# Patient Record
Sex: Female | Born: 1990 | Race: White | Hispanic: No | Marital: Single | Smoking: Former smoker
Health system: Southern US, Community
[De-identification: ages and names within clinical notes are randomized; demographics above are authoritative.]

## PROBLEM LIST (undated history)

## (undated) DIAGNOSIS — N644 Mastodynia: Secondary | ICD-10-CM

## (undated) HISTORY — PX: TONSILLECTOMY: SUR1361

---
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
## 2012-01-03 ENCOUNTER — Observation Stay: Payer: Self-pay | Admitting: Obstetrics and Gynecology

## 2012-04-18 ENCOUNTER — Observation Stay: Payer: Self-pay | Admitting: Obstetrics and Gynecology

## 2012-05-06 ENCOUNTER — Inpatient Hospital Stay: Payer: Self-pay | Admitting: Obstetrics and Gynecology

## 2012-05-06 LAB — CBC WITH DIFFERENTIAL/PLATELET
Basophil #: 0 10*3/uL (ref 0.0–0.1)
Basophil %: 0.5 %
Eosinophil #: 0.1 10*3/uL (ref 0.0–0.7)
Eosinophil %: 0.9 %
HGB: 12.8 g/dL (ref 12.0–16.0)
Lymphocyte %: 22.3 %
MCV: 91 fL (ref 80–100)
Monocyte #: 0.7 x10 3/mm (ref 0.2–0.9)
Monocyte %: 9.1 %
Neutrophil #: 5.1 10*3/uL (ref 1.4–6.5)
RBC: 4.11 10*6/uL (ref 3.80–5.20)
WBC: 7.6 10*3/uL (ref 3.6–11.0)

## 2012-05-06 LAB — PROTEIN / CREATININE RATIO, URINE
Creatinine, Urine: 22.2 mg/dL — ABNORMAL LOW (ref 30.0–125.0)
Protein, Random Urine: <5 mg/dL — ABNORMAL LOW (ref 0–12)

## 2012-05-10 LAB — PLATELET COUNT: Platelet: 167 10*3/uL (ref 150–440)

## 2013-07-15 ENCOUNTER — Ambulatory Visit: Payer: Self-pay | Admitting: Family Medicine

## 2013-08-13 ENCOUNTER — Emergency Department: Payer: Self-pay | Admitting: Emergency Medicine

## 2013-11-03 ENCOUNTER — Ambulatory Visit: Payer: Self-pay | Admitting: Podiatry

## 2014-02-02 ENCOUNTER — Emergency Department: Payer: Self-pay | Admitting: Emergency Medicine

## 2014-02-02 LAB — URINALYSIS, COMPLETE
BLOOD: NEGATIVE
Bilirubin,UR: NEGATIVE
GLUCOSE, UR: NEGATIVE mg/dL (ref 0–75)
Ketone: NEGATIVE
Nitrite: NEGATIVE
PH: 8 (ref 4.5–8.0)
PROTEIN: NEGATIVE
RBC,UR: 1 /HPF (ref 0–5)
SQUAMOUS EPITHELIAL: NONE SEEN
Specific Gravity: 1.01 (ref 1.003–1.030)

## 2014-02-04 LAB — URINE CULTURE

## 2014-12-14 ENCOUNTER — Emergency Department: Payer: Self-pay | Admitting: Emergency Medicine

## 2014-12-14 LAB — CBC
HCT: 40.4 % (ref 35.0–47.0)
HGB: 13.2 g/dL (ref 12.0–16.0)
MCH: 29.9 pg (ref 26.0–34.0)
MCHC: 32.6 g/dL (ref 32.0–36.0)
MCV: 92 fL (ref 80–100)
PLATELETS: 262 10*3/uL (ref 150–440)
RBC: 4.4 10*6/uL (ref 3.80–5.20)
RDW: 13 % (ref 11.5–14.5)
WBC: 7.8 10*3/uL (ref 3.6–11.0)

## 2014-12-14 LAB — COMPREHENSIVE METABOLIC PANEL
ALBUMIN: 3.8 g/dL (ref 3.4–5.0)
ALT: 20 U/L
AST: 9 U/L — AB (ref 15–37)
Alkaline Phosphatase: 80 U/L
Anion Gap: 5 — ABNORMAL LOW (ref 7–16)
BILIRUBIN TOTAL: 0.4 mg/dL (ref 0.2–1.0)
BUN: 13 mg/dL (ref 7–18)
CALCIUM: 8.7 mg/dL (ref 8.5–10.1)
CHLORIDE: 107 mmol/L (ref 98–107)
Co2: 29 mmol/L (ref 21–32)
Creatinine: 0.69 mg/dL (ref 0.60–1.30)
EGFR (African American): >60
EGFR (Non-African Amer.): >60
Glucose: 94 mg/dL (ref 65–99)
Osmolality: 281 (ref 275–301)
Potassium: 3.8 mmol/L (ref 3.5–5.1)
SODIUM: 141 mmol/L (ref 136–145)
Total Protein: 7 g/dL (ref 6.4–8.2)

## 2014-12-14 LAB — URINALYSIS, COMPLETE
RBC,UR: 4945 /HPF (ref 0–5)
SPECIFIC GRAVITY: 1.026 (ref 1.003–1.030)
WBC UR: 18 /HPF (ref 0–5)

## 2015-04-24 NOTE — Discharge Summary (Signed)
PATIENT NAMTomma Woods:  Call, Brittanya L MR#:  [id number] DATE OF BIRTH:  Jul 12, 1990  DATE OF ADMISSION:  05/06/2012 DATE OF DISCHARGE:  05/11/2012  PRINCIPAL PROCEDURE: Trial of labor followed by low transverse cesarean section for cephalopelvic disproportion.   HOSPITAL COURSE: The patient was diagnosed with pregnancy-induced hypertension prior to surgery and was started on magnesium sulfate for prophylaxis. The patient's postoperative day one hematocrit was 31.0%. Blood pressures remained elevated. On postoperative day three, she was started on labetalol 100 mg b.i.d.  The patient was discharged home on postoperative day four without complication with Norco, prenatal vitamins, and ibuprofen. Staples were removed and Steri-Strips applied.   FOLLOW UP: The patient will follow up with Dr. Feliberto GottronSchermerhorn in two weeks for wound care or before if she has wound drainage, fever, nausea, or vomiting.   ____________________________ Suzy Bouchardhomas J. Adrienna Karis, MD tjs:cbb D: 05/19/2012 10:34:19 ET T: 05/19/2012 10:49:36 ET JOB#: [id number]  cc: Suzy Bouchardhomas J. Juvon Teater, MD, <Dictator> Suzy BouchardHOMAS J Skyelyn Scruggs MD ELECTRONICALLY SIGNED 05/22/2012 9:12

## 2015-04-24 NOTE — Op Note (Signed)
PATIENT NAMEKIERSTON, Woods MR#:  [id number] DATE OF BIRTH:  1990-09-30  DATE OF PROCEDURE:  05/07/2012  PREOPERATIVE DIAGNOSES:  1. Arrest of descent. 2. Cephalopelvic disproportion.   POSTOPERATIVE DIAGNOSES:  1. Arrest of descent. 2. Cephalopelvic disproportion.   PROCEDURE: Primary low transverse cesarean section.   SURGEON: Suzy Bouchard, M.D.   FIRST ASSISTANT: Acquanetta Belling, MD  ANESTHESIA: Spinal.   INDICATION: This is a 25 year old gravida 1, para 0 a patient who has been laboring throughout the night. She pushed for three hours with fetal caput noted on exam with arrest of descent diagnosis.   DESCRIPTION OF PROCEDURE: After adequate spinal anesthesia, the patient was placed in the dorsal supine position with a hip roll under the right side. The patient's abdomen was prepped and draped in normal sterile fashion. A Pfannenstiel incision was made two fingerbreadths above the symphysis pubis. Sharp dissection was used to identify the fascia. The fascia was opened in the midline and opened in a transverse fashion. The superior aspect of the fascia was grasped with Kocher clamps and the recti muscles dissected free. The inferior aspect of the fascia was grasped with Kocher clamps and the pyramidalis muscle was dissected free. Entry into the peritoneal cavity was accomplished sharply. The vesicouterine peritoneal fold was identified and a bladder flap was created. The bladder was reflected inferior. A low transverse uterine incision was made. Upon entry into the endometrial cavity, clear fluid resulted. The incision was extended with blunt transverse traction. An extremely wedged fetal head was encountered. A nursing hand was requested and nursing assisted with vaginal hand to elevate the fetal head and the head was brought to the incision followed by placement of the vacuum to the occiput. With one gentle pull the head was delivered. The vacuum was removed. A loose nuchal cord  was reduced. The shoulders and body were delivered. A slightly floppy infant was passed to Dr. Awanda Mink who assigned Apgar scores of 7 and 9. The placenta was manually delivered and intravenous Pitocin was administered. The uterus was exteriorized. The endometrial cavity was wiped clean with laparotomy tape. The uterine incision was then closed with one chromic suture in a running locking fashion with good approximation of edges. Good hemostasis was noted. The fallopian tubes and ovaries appeared normal. The posterior cul-de-sac was irrigated and suctioned. The uterus was placed back into the abdominal cavity. The paracolic gutters were wiped clean with laparotomy tape, and the uterine incision again appeared hemostatic. Interceed was placed over the uterine incision, in a T-shaped fashion. The On-Q pump system was brought up to the operative field. Two catheters were placed 1 cm inferior to the umbilicus and were placed subfascially. The fascia was then closed above the catheters with 0 Vicryl in a running nonlocking fashion. The subcutaneous tissues were then irrigated and bovied for hemostasis. The skin was reapproximated with staples. The catheters were then coiled on the abdomen and secured with Tegaderm placed on top and 5 mL of 0.5% Marcaine was placed at each catheter site. There were no complications. Estimated blood loss 600 mL. The patient received 2 grams IV Ancef prior to commencement of the case. Magnesium was stopped temporarily during the procedure and will be restarted postoperatively for eclampsia prophylaxis. Intraoperative fluids 1400 mL. The patient did have bloody urine prior to commencement of the case and the Foley bag still showed blood-tinged urine at the end of the case. The patient was taken to the recovery room in good condition.  ____________________________  Suzy Bouchardhomas J. Dreana Britz, MD tjs:slb D: 05/07/2012 14:38:20 ET T: 05/07/2012 15:08:44 ET JOB#: [id number]  cc: Suzy Bouchardhomas J.  Kastin Cerda, MD, <Dictator> Suzy BouchardHOMAS J Brynn Mulgrew MD ELECTRONICALLY SIGNED 05/12/2012 7:26

## 2015-05-10 NOTE — H&P (Signed)
L&D Evaluation:  History:   HPI 25 y/o G1 @ 39/6wks EDC 05/07/12 arrives with c/o regular contractions, denies leaking fluid or vaginal bleeding, baby is active. 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    Presents with contractions    Patient's Medical History No Chronic Illness    Patient's Surgical History none    Medications Pre Natal Vitamins    Allergies NKDA    Social History none    Family History Non-Contributory   ROS:   ROS All systems were reviewed.  HEENT, CNS, GI, GU, Respiratory, CV, Renal and Musculoskeletal systems were found to be normal.   Exam:   Vital Signs stable    Urine Protein not completed    General no apparent distress    Mental Status clear    Estimated Fetal Weight Average for gestational age    Fetal Position vtx    Fundal Height term    Back no CVAT    Edema 1+    Reflexes 2+    Clonus negative    Pelvic no external lesions, 2-3cm 50% vtx @-2 Bowi sm show    Mebranes Intact    FHT normal rate with no decels, baseline 130's 140's avg variability    Fetal Heart Rate 136    Ucx irregular, EFM reapplied    Skin dry    Lymph no lymphadenopathy   Impression:   Impression early labor   Plan:   Plan monitor contractions and for cervical change    Comments Admitted, explained to pt and Mom what to expect with first baby, dc pain management options; plans epidural with labor progess. questions answered consent obtained. Will augment labor with pitocin.   Electronic Signatures: Albertina ParrLugiano, Tallin Hart B (CNM)  (Signed 07-May-13 18:42)  Authored: L&D Evaluation   Last Updated: 07-May-13 18:42 by Albertina ParrLugiano, Keary Waterson B (CNM)

## 2015-08-20 IMAGING — CT CT ABD-PELV W/O CM
1 of 4 series · 4 of 46 positions shown, 9 images · non-contrast
Comparison: None.

CLINICAL DATA: Intermittent a left flank pain for 2 weeks, dysuria

EXAM:
CT ABDOMEN AND PELVIS WITHOUT CONTRAST
TECHNIQUE: Multidetector CT imaging of the abdomen and pelvis was performed
following the standard protocol without IV contrast.

[Series 4: lung windows · axial · 0.55mm/px · z∈[+104,+154]mm · 4 of 18 slices shown, 9 images]
[im 4/18  soft-tissue]
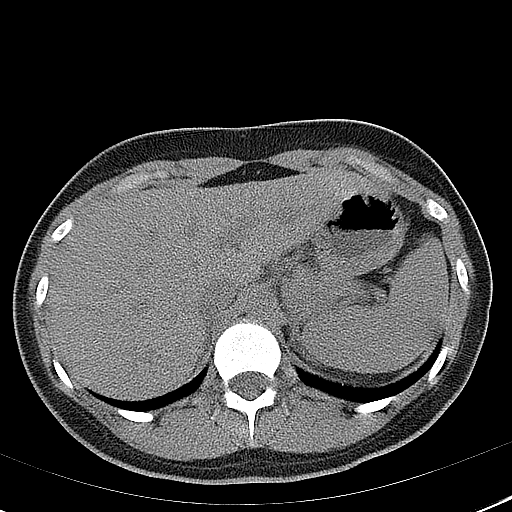
[im 4/18  lung]
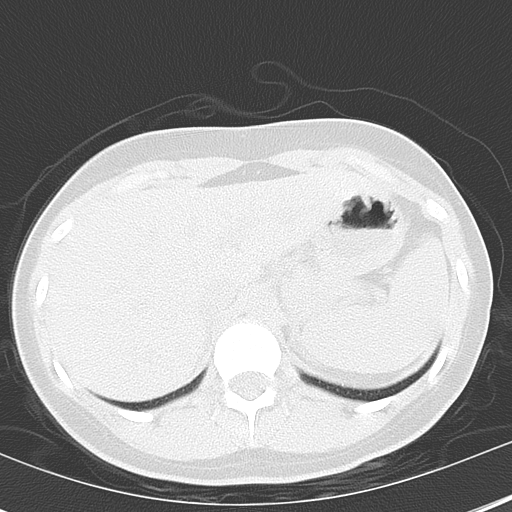
[im 4/18  bone]
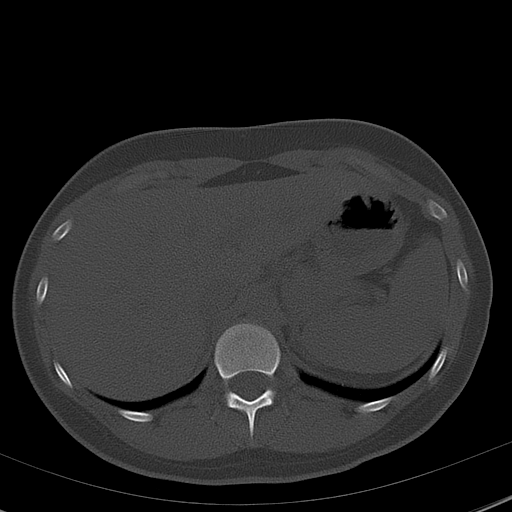
[im 7/18  soft-tissue]
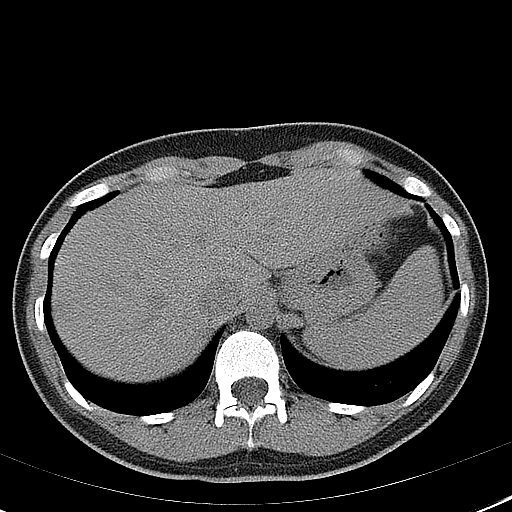
[im 7/18  lung]
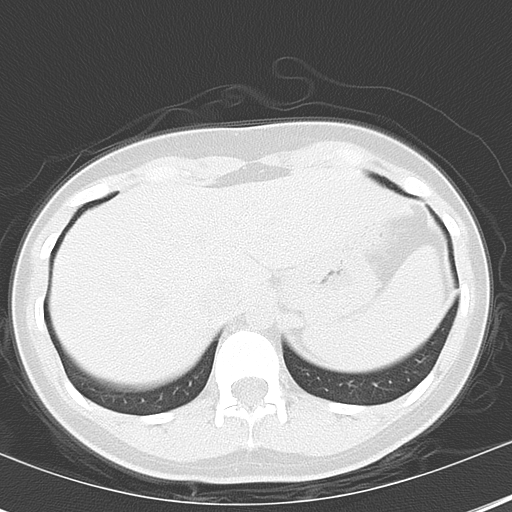
[im 11/18  soft-tissue]
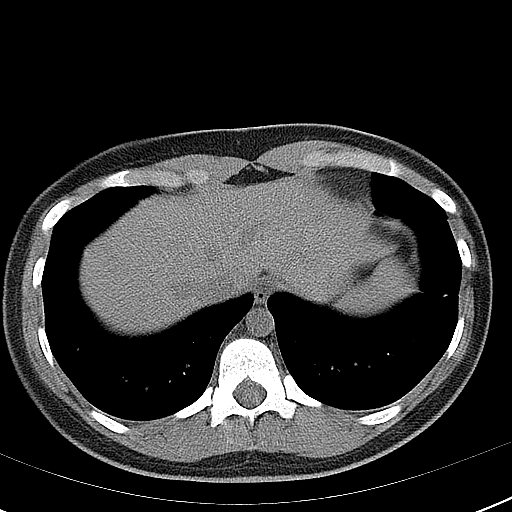
[im 11/18  lung]
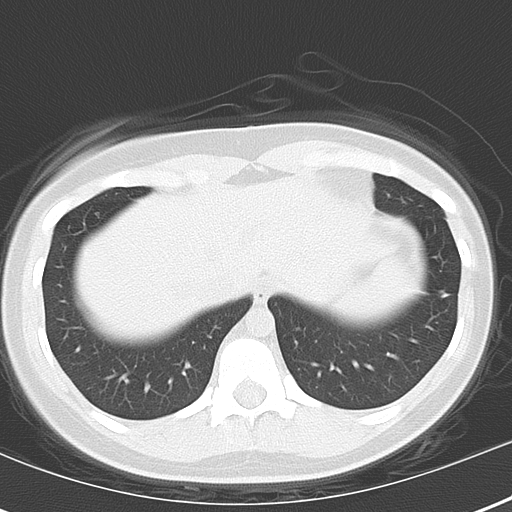
[im 14/18  soft-tissue]
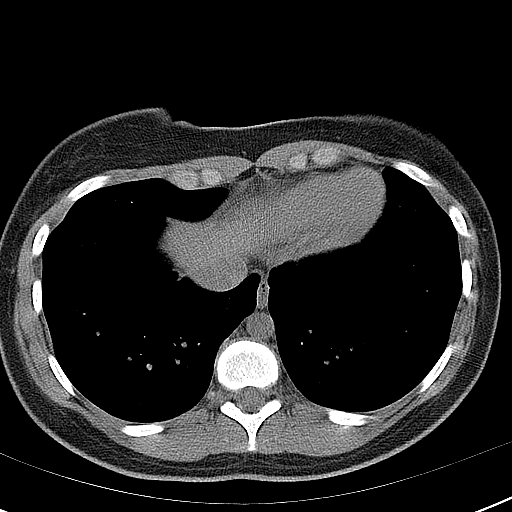
[im 14/18  lung]
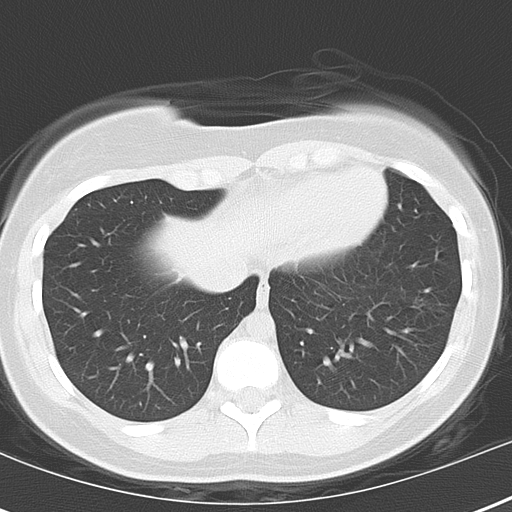

[4 of 46 positions shown; findings below may reference images not displayed]

FINDINGS: Lung bases are unremarkable. Sagittal images of the spine are
unremarkable. There is a calcified gallstone within gallbladder
measures 4.5 mm. No pericholecystic fluid. No intrahepatic biliary
ductal dilatation. Unenhanced pancreas, spleen and adrenal glands
are unremarkable. Unenhanced kidneys are symmetrical in size. No
nephrolithiasis. No hydronephrosis or hydroureter. No calcified
ureteral calculi are noted.

No aortic aneurysm.

No small bowel obstruction. Tiny umbilical hernia containing fat
without evidence of acute complication. No ascites or free air. No
adenopathy. The terminal ileum is unremarkable. There is no
pericecal inflammation. Normal appendix partially visualized in
coronal image fifty-two.

Anteflexed uterus is noted. Mild thickening of urinary bladder wall.
Mild cystitis cannot be excluded. No calcified calculi are noted
within urinary bladder. Small amount of nonspecific air is noted
within vagina. Some stool noted within rectum. No calcified calculi
are noted within urinary bladder.
IMPRESSION: 1. No nephrolithiasis.  No hydronephrosis or hydroureter.
2. No pericecal inflammation.  Normal appendix partially visualized.
3. No calcified ureteral calculi are noted bilaterally.
4. Mild thickening of urinary bladder wall. Clinical correlation is
necessary to exclude mild cystitis.
5. No small bowel obstruction.

## 2016-01-06 ENCOUNTER — Encounter: Payer: Self-pay | Admitting: Emergency Medicine

## 2016-01-06 ENCOUNTER — Emergency Department
Admission: EM | Admit: 2016-01-06 | Discharge: 2016-01-06 | Disposition: A | Discharge status: Home / Self Care | Payer: Self-pay | Attending: Student | Admitting: Student

## 2016-01-06 DIAGNOSIS — Z87891 Personal history of nicotine dependence: Secondary | ICD-10-CM | POA: Insufficient documentation

## 2016-01-06 DIAGNOSIS — Z3202 Encounter for pregnancy test, result negative: Secondary | ICD-10-CM | POA: Insufficient documentation

## 2016-01-06 DIAGNOSIS — A5901 Trichomonal vulvovaginitis: Secondary | ICD-10-CM | POA: Insufficient documentation

## 2016-01-06 LAB — URINALYSIS COMPLETE WITH MICROSCOPIC (ARMC ONLY)
Bacteria, UA: NONE SEEN
Bilirubin Urine: NEGATIVE
Glucose, UA: NEGATIVE mg/dL
Hgb urine dipstick: NEGATIVE
KETONES UR: NEGATIVE mg/dL
Nitrite: NEGATIVE
PROTEIN: 30 mg/dL — AB
Specific Gravity, Urine: 1.026 (ref 1.005–1.030)
pH: 5 (ref 5.0–8.0)

## 2016-01-06 LAB — WET PREP, GENITAL
CLUE CELLS WET PREP: NONE SEEN
Sperm: NONE SEEN
Yeast Wet Prep HPF POC: NONE SEEN

## 2016-01-06 LAB — CHLAMYDIA/NGC RT PCR (ARMC ONLY)
CHLAMYDIA TR: NOT DETECTED
N gonorrhoeae: NOT DETECTED

## 2016-01-06 LAB — PREGNANCY, URINE: Preg Test, Ur: NEGATIVE

## 2016-01-06 MED ORDER — METRONIDAZOLE 500 MG PO TABS: 500.0000 mg | ORAL_TABLET | Freq: Two times a day (BID) | ORAL | Status: DC

## 2016-01-06 NOTE — ED Notes (Signed)
Reports burning with urination and vaginal discharge.

## 2016-01-06 NOTE — ED Provider Notes (Signed)
[facility] Emergency Department Provider Note ____________________________________________  Time seen: Approximately 12:03 PM  I have reviewed the triage vital signs and the nursing notes.   HISTORY  Chief Complaint Dysuria and Vaginal Discharge   HPI Jeanette Woods is a 26 y.o. female is here with complaint of dysuria along with a green discharge that she noticed this morning. Patient denies any sex partners. She states that her urine has been smelling very strong.She denies any fever or chills. She has not had a nausea vomiting. She rates her pain at 5/10.   History reviewed. No pertinent past medical history.  There are no active problems to display for this patient.   History reviewed. No pertinent past surgical history.  Current Outpatient Rx  Name  Route  Sig  Dispense  Refill  . metroNIDAZOLE (FLAGYL) 500 MG tablet   Oral   Take 1 tablet (500 mg total) by mouth 2 (two) times daily.   14 tablet   0     Allergies Review of patient's allergies indicates no known allergies.  History reviewed. No pertinent family history.  Social History Social History  Substance Use Topics  . Smoking status: Former Games developer  . Smokeless tobacco: None  . Alcohol Use: None    Review of Systems Constitutional: No fever/chills ENT: No sore throat. Cardiovascular: Denies chest pain. Respiratory: Denies shortness of breath. Gastrointestinal: No abdominal pain.  No nausea, no vomiting.   Genitourinary: Positive for dysuria. Positive for vaginal discharge. Musculoskeletal: Negative for back pain. Skin: Negative for rash. Neurological: Negative for headaches, focal weakness or numbness.  10-point ROS otherwise negative.  ____________________________________________   PHYSICAL EXAM:  VITAL SIGNS: ED Triage Vitals  Enc Vitals Group     BP 01/06/16 1048 109/72 mmHg     Pulse Rate 01/06/16 1048 84     Resp 01/06/16 1048 16     Temp 01/06/16 1048 98.5  F (36.9 C)     Temp Source 01/06/16 1048 Oral     SpO2 01/06/16 1048 100 %     Weight 01/06/16 1048 113 lb (51.256 kg)     Height 01/06/16 1048 5\' 1"  (1.549 m)     Head Cir --      Peak Flow --      Pain Score 01/06/16 1042 5     Pain Loc --      Pain Edu? --      Excl. in GC? --     Constitutional: Alert and oriented. Well appearing and in no acute distress. Eyes: Conjunctivae are normal. PERRL. EOMI. Head: Atraumatic. Nose: No congestion/rhinnorhea. Neck: No stridor.   Cardiovascular: Normal rate, regular rhythm. Grossly normal heart sounds.  Good peripheral circulation. Respiratory: Normal respiratory effort.  No retractions. Lungs CTAB. Gastrointestinal: Soft and nontender. No distention.  Genitourinary: Pelvic exam there is moderate amount of greenish yellow, frothy vaginal secretions. There is no adnexal masses or tenderness. There is no cervical motion tenderness. Musculoskeletal: Moves upper and lower extremities without any difficulty. Neurologic:  Normal speech and language. No gross focal neurologic deficits are appreciated. No gait instability. Skin:  Skin is warm, dry and intact. No rash noted. Psychiatric: Mood and affect are normal. Speech and behavior are normal.  ____________________________________________   LABS (all labs ordered are listed, but only abnormal results are displayed)  Labs Reviewed  WET PREP, GENITAL - Abnormal; Notable for the following:    Trich, Wet Prep PRESENT (*)    WBC, Wet Prep HPF POC  MODERATE (*)    All other components within normal limits  URINALYSIS COMPLETEWITH MICROSCOPIC ([facility] ONLY) - Abnormal; Notable for the following:    Color, Urine YELLOW (*)    APPearance CLOUDY (*)    Protein, ur 30 (*)    Leukocytes, UA 3+ (*)    Squamous Epithelial / LPF 6-30 (*)    All other components within normal limits  CHLAMYDIA/NGC RT PCR ([facility] ONLY)  PREGNANCY, URINE  POC URINE PREG, ED    PROCEDURES  Procedure(s)  performed: None  Critical Care performed: No  ____________________________________________   INITIAL IMPRESSION / ASSESSMENT AND PLAN / ED COURSE  Pertinent labs & imaging results that were available during my care of the patient were reviewed by me and considered in my medical decision making (see chart for details).  Patient was given a prescription for Flagyl father milligrams twice a day for 7 days. She is follow-up with the health department if any continued problems or her PCP. ____________________________________________   FINAL CLINICAL IMPRESSION(S) / ED DIAGNOSES  Final diagnoses:  Trichomonal vaginitis      Tommi RumpsRhonda L Summers, PA-C 01/06/16 1517  Gayla DossEryka A Gayle, MD 01/06/16 1551

## 2016-01-06 NOTE — ED Notes (Signed)
Pt states green discharge this AM when she used the bathroom, denies any new sex partners, states strong smelling urine

## 2016-01-06 NOTE — Discharge Instructions (Signed)
Trichomoniasis Trichomoniasis is an infection caused by an organism called Trichomonas. The infection can affect both women and men. In women, the outer female genitalia and the vagina are affected. In men, the penis is mainly affected, but the prostate and other reproductive organs can also be involved. Trichomoniasis is a sexually transmitted infection (STI) and is most often passed to another person through sexual contact.  RISK FACTORS  Having unprotected sexual intercourse.  Having sexual intercourse with an infected partner. SIGNS AND SYMPTOMS  Symptoms of trichomoniasis in women include:  Abnormal gray-green frothy vaginal discharge.  Itching and irritation of the vagina.  Itching and irritation of the area outside the vagina. Symptoms of trichomoniasis in men include:   Penile discharge with or without pain.  Pain during urination. This results from inflammation of the urethra. DIAGNOSIS  Trichomoniasis may be found during a Pap test or physical exam. Your health care provider may use one of the following methods to help diagnose this infection:  Testing the pH of the vagina with a test tape.  Using a vaginal swab test that checks for the Trichomonas organism. A test is available that provides results within a few minutes.  Examining a urine sample.  Testing vaginal secretions. Your health care provider may test you for other STIs, including HIV. TREATMENT   You may be given medicine to fight the infection. Women should inform their health care provider if they could be or are pregnant. Some medicines used to treat the infection should not be taken during pregnancy.  Your health care provider may recommend over-the-counter medicines or creams to decrease itching or irritation.  Your sexual partner will need to be treated if infected.  Your health care provider may test you for infection again 3 months after treatment. HOME CARE INSTRUCTIONS   Take medicines only as  directed by your health care provider.  Take over-the-counter medicine for itching or irritation as directed by your health care provider.  Do not have sexual intercourse while you have the infection.  Women should not douche or wear tampons while they have the infection.  Discuss your infection with your partner. Your partner may have gotten the infection from you, or you may have gotten it from your partner.  Have your sex partner get examined and treated if necessary.  Practice safe, informed, and protected sex.  See your health care provider for other STI testing. SEEK MEDICAL CARE IF:   You still have symptoms after you finish your medicine.  You develop abdominal pain.  You have pain when you urinate.  You have bleeding after sexual intercourse.  You develop a rash.  Your medicine makes you sick or makes you throw up (vomit). MAKE SURE YOU:  Understand these instructions.  Will watch your condition.  Will get help right away if you are not doing well or get worse.   This information is not intended to replace advice given to you by your health care provider. Make sure you discuss any questions you have with your health care provider.   Document Released: 06/12/2001 Document Revised: 01/07/2015 Document Reviewed: 09/28/2013 Elsevier Interactive Patient Education 2016 [employer]  

## 2016-11-01 ENCOUNTER — Encounter: Payer: Self-pay | Admitting: *Deleted

## 2016-11-01 ENCOUNTER — Emergency Department
Admission: EM | Admit: 2016-11-01 | Discharge: 2016-11-01 | Disposition: A | Discharge status: Home / Self Care | Payer: BLUE CROSS/BLUE SHIELD | Attending: Emergency Medicine | Admitting: Emergency Medicine

## 2016-11-01 DIAGNOSIS — R197 Diarrhea, unspecified: Secondary | ICD-10-CM | POA: Insufficient documentation

## 2016-11-01 DIAGNOSIS — Z87891 Personal history of nicotine dependence: Secondary | ICD-10-CM | POA: Insufficient documentation

## 2016-11-01 DIAGNOSIS — R112 Nausea with vomiting, unspecified: Secondary | ICD-10-CM | POA: Insufficient documentation

## 2016-11-01 DIAGNOSIS — R1084 Generalized abdominal pain: Secondary | ICD-10-CM | POA: Insufficient documentation

## 2016-11-01 LAB — COMPREHENSIVE METABOLIC PANEL
ALBUMIN: 4.5 g/dL (ref 3.5–5.0)
ALT: 13 U/L — ABNORMAL LOW (ref 14–54)
AST: 22 U/L (ref 15–41)
Alkaline Phosphatase: 62 U/L (ref 38–126)
Anion gap: 6 (ref 5–15)
BILIRUBIN TOTAL: 0.6 mg/dL (ref 0.3–1.2)
BUN: 20 mg/dL (ref 6–20)
CHLORIDE: 105 mmol/L (ref 101–111)
CO2: 27 mmol/L (ref 22–32)
Calcium: 9.3 mg/dL (ref 8.9–10.3)
Creatinine, Ser: 0.76 mg/dL (ref 0.44–1.00)
GFR calc Af Amer: >60 mL/min (ref >60)
GFR calc non Af Amer: >60 mL/min (ref >60)
GLUCOSE: 99 mg/dL (ref 65–99)
POTASSIUM: 3.7 mmol/L (ref 3.5–5.1)
Sodium: 138 mmol/L (ref 135–145)
TOTAL PROTEIN: 7.2 g/dL (ref 6.5–8.1)

## 2016-11-01 LAB — URINALYSIS COMPLETE WITH MICROSCOPIC (ARMC ONLY)
Bilirubin Urine: NEGATIVE
Glucose, UA: NEGATIVE mg/dL
Hgb urine dipstick: NEGATIVE
Leukocytes, UA: NEGATIVE
Nitrite: NEGATIVE
PROTEIN: 30 mg/dL — AB
Specific Gravity, Urine: 1.028 (ref 1.005–1.030)
pH: 5 (ref 5.0–8.0)

## 2016-11-01 LAB — CBC
HEMATOCRIT: 43.9 % (ref 35.0–47.0)
Hemoglobin: 15 g/dL (ref 12.0–16.0)
MCH: 30.5 pg (ref 26.0–34.0)
MCHC: 34.3 g/dL (ref 32.0–36.0)
MCV: 88.9 fL (ref 80.0–100.0)
Platelets: 224 10*3/uL (ref 150–440)
RBC: 4.94 MIL/uL (ref 3.80–5.20)
RDW: 13.2 % (ref 11.5–14.5)
WBC: 4.1 10*3/uL (ref 3.6–11.0)

## 2016-11-01 LAB — LIPASE, BLOOD: Lipase: 36 U/L (ref 11–51)

## 2016-11-01 LAB — POCT PREGNANCY, URINE: PREG TEST UR: NEGATIVE

## 2016-11-01 MED ORDER — ONDANSETRON HCL 4 MG/2ML IJ SOLN
4.0000 mg | Freq: Once | INTRAMUSCULAR | Status: AC
  Administered 2016-11-01: 4 mg via INTRAVENOUS
  Filled 2016-11-01: qty 2

## 2016-11-01 MED ORDER — KETOROLAC TROMETHAMINE 30 MG/ML IJ SOLN
30.0000 mg | Freq: Once | INTRAMUSCULAR | Status: AC
  Administered 2016-11-01: 30 mg via INTRAVENOUS
  Filled 2016-11-01: qty 1

## 2016-11-01 MED ORDER — ONDANSETRON 4 MG PO TBDP: 4.0000 mg | ORAL_TABLET | Freq: Three times a day (TID) | ORAL | 0 refills | Status: DC | PRN

## 2016-11-01 MED ORDER — SODIUM CHLORIDE 0.9 % IV BOLUS (SEPSIS)
1000.0000 mL | Freq: Once | INTRAVENOUS | Status: AC
  Administered 2016-11-01: 1000 mL via INTRAVENOUS

## 2016-11-01 NOTE — ED Notes (Signed)
Pt unable to void at this time. 

## 2016-11-01 NOTE — ED Notes (Signed)
Pt given italian ice for fluid challenge. 

## 2016-11-01 NOTE — ED Notes (Addendum)
Pt has multiple complaints - has been loosing weight after being put on wellbutrin so her dr changed her over to paxil last month which helped. Came in today for nausea, vomiting, sweats, headache x 3-4 days. Blood work sent. Pt has not tried ice chips or popsicles.

## 2016-11-01 NOTE — ED Provider Notes (Signed)
[facility] Emergency Department Provider Note  ____________________________________________  Time seen: Approximately 7:35 PM  I have reviewed the triage vital signs and the nursing notes.   HISTORY  Chief Complaint Emesis and Abdominal Pain   HPI Jeanette Woods is a 26 y.o. female history of depression who presents for evaluation of abdominal pain, vomiting, diarrhea. Patient reports her symptoms started 4 days ago. She reports she hasn't been able to eat due to severe nausea. She has had 2 episodes of nonbloody nonbilious emesis today and several episodes of nonbloody watery diarrhea. She denies melena, hematemesis, hematochezia, fever, chills, chest pain, shortness of breath, dysuria, hematuria, vaginal discharge. She also endorses diffuse cramping abdominal pain, worse in the epigastric region, currently 6/10. She reports that her daughter had similar symptoms and then her mother and now her.No recent abx use.   No past medical history on file.  There are no active problems to display for this patient.   No past surgical history on file.  Prior to Admission medications   Medication Sig Start Date End Date Taking? Authorizing Provider  metroNIDAZOLE (FLAGYL) 500 MG tablet Take 1 tablet (500 mg total) by mouth 2 (two) times daily. 01/06/16   Tommi Rumpshonda L Summers, PA-C  ondansetron (ZOFRAN ODT) 4 MG disintegrating tablet Take 1 tablet (4 mg total) by mouth every 8 (eight) hours as needed for nausea or vomiting. 11/01/16   Nita Sicklearolina Shauni Henner, MD    Allergies Review of patient's allergies indicates no known allergies.  No family history on file.  Social History Social History  Substance Use Topics  . Smoking status: Former Games developermoker  . Smokeless tobacco: Never Used  . Alcohol use Yes    Review of Systems  Constitutional: Negative for fever. Eyes: Negative for visual changes. ENT: Negative for sore throat. Cardiovascular: Negative for chest  pain. Respiratory: Negative for shortness of breath. Gastrointestinal: + diffuse abdominal pain, vomiting and diarrhea. Genitourinary: Negative for dysuria. Musculoskeletal: Negative for back pain. Skin: Negative for rash. Neurological: Negative for headaches, weakness or numbness.  ____________________________________________   PHYSICAL EXAM:  VITAL SIGNS: ED Triage Vitals  Enc Vitals Group     BP 11/01/16 1827 111/69     Pulse Rate 11/01/16 1827 73     Resp 11/01/16 1827 16     Temp 11/01/16 1827 98.7 F (37.1 C)     Temp Source 11/01/16 1827 Oral     SpO2 11/01/16 1827 100 %     Weight 11/01/16 1827 94 lb (42.6 kg)     Height 11/01/16 1827 4\' 11"  (1.499 m)     Head Circumference --      Peak Flow --      Pain Score 11/01/16 1833 6     Pain Loc --      Pain Edu? --      Excl. in GC? --     Constitutional: Alert and oriented. Well appearing and in no apparent distress. HEENT:      Head: Normocephalic and atraumatic.         Eyes: Conjunctivae are normal. Sclera is non-icteric. EOMI. PERRL      Mouth/Throat: Mucous membranes are moist.       Neck: Supple with no signs of meningismus. Cardiovascular: Regular rate and rhythm. No murmurs, gallops, or rubs. 2+ symmetrical distal pulses are present in all extremities. No JVD. Respiratory: Normal respiratory effort. Lungs are clear to auscultation bilaterally. No wheezes, crackles, or rhonchi.  Gastrointestinal: Soft, mildly tender to  palpation on epigastric region and LUQ, non distended with positive bowel sounds. No rebound or guarding. Genitourinary: No CVA tenderness. Musculoskeletal: Nontender with normal range of motion in all extremities. No edema, cyanosis, or erythema of extremities. Neurologic: Normal speech and language. Face is symmetric. Moving all extremities. No gross focal neurologic deficits are appreciated. Skin: Skin is warm, dry and intact. No rash noted. Psychiatric: Mood and affect are normal. Speech and  behavior are normal.  ____________________________________________   LABS (all labs ordered are listed, but only abnormal results are displayed)  Labs Reviewed  COMPREHENSIVE METABOLIC PANEL - Abnormal; Notable for the following:       Result Value   ALT 13 (*)    All other components within normal limits  URINALYSIS COMPLETEWITH MICROSCOPIC ([facility] ONLY) - Abnormal; Notable for the following:    Color, Urine YELLOW (*)    APPearance HAZY (*)    Ketones, ur 1+ (*)    Protein, ur 30 (*)    Bacteria, UA RARE (*)    Squamous Epithelial / LPF 0-5 (*)    All other components within normal limits  LIPASE, BLOOD  CBC  POCT PREGNANCY, URINE  POC URINE PREG, ED   ____________________________________________  EKG  none  ____________________________________________  RADIOLOGY  none  ____________________________________________   PROCEDURES  Procedure(s) performed: None Procedures Critical Care performed:  None ____________________________________________   INITIAL IMPRESSION / ASSESSMENT AND PLAN / ED COURSE  26 y.o. female history of depression who presents for evaluation of abdominal pain, vomiting, diarrhea x 4 days. Patient is well-appearing, in no distress, has normal vital signs, her belly is soft with mild epigastric and left upper quadrant tenderness to palpation, no rebound or guarding, positive bowel sounds. Her labs show normal CBC with no leukocytosis, normal CMP with normal creatinine, normal lipase. UA new pressure pending. Presentation concerning for gastroenteritis. Plan for IV fluids, IV Zofran, IV Toradol, and discharge home with a prescription for Zofran.  Clinical Course  Comment By Time  Patient tolerating by mouth. Sleeping comfortably in the emergency room. Blood work with no acute findings. A UA with no evidence of urinary tract infection. U pregnant negative. Patient has had no vomiting or diarrhea in the emergency room. We'll discharge home with  Zofran and supportive care and close follow-up with primary care doctor. Nita Sicklearolina Aavya Shafer, MD 11/02 2222    Pertinent labs & imaging results that were available during my care of the patient were reviewed by me and considered in my medical decision making (see chart for details).    ____________________________________________   FINAL CLINICAL IMPRESSION(S) / ED DIAGNOSES  Final diagnoses:  Generalized abdominal pain  Nausea vomiting and diarrhea      NEW MEDICATIONS STARTED DURING THIS VISIT:  New Prescriptions   ONDANSETRON (ZOFRAN ODT) 4 MG DISINTEGRATING TABLET    Take 1 tablet (4 mg total) by mouth every 8 (eight) hours as needed for nausea or vomiting.     Note:  This document was prepared using Dragon voice recognition software and may include unintentional dictation errors.    Nita Sicklearolina Berthold Glace, MD 11/01/16 2223

## 2016-11-01 NOTE — ED Triage Notes (Signed)
Pt reports she has vomiting and abd pain for 4 days.  No vag bleeding.  No dysuria.    Pt has had recent weight loss during the last month   Pt started citalopram 1 month ago.  Pt alert.

## 2016-12-20 ENCOUNTER — Other Ambulatory Visit: Payer: Self-pay | Admitting: Family Medicine

## 2016-12-20 DIAGNOSIS — N644 Mastodynia: Secondary | ICD-10-CM

## 2017-01-02 ENCOUNTER — Ambulatory Visit
Admission: RE | Admit: 2017-01-02 | Discharge: 2017-01-02 | Disposition: A | Discharge status: Home / Self Care | Payer: BLUE CROSS/BLUE SHIELD | Source: Ambulatory Visit | Attending: Family Medicine | Admitting: Family Medicine

## 2017-01-02 DIAGNOSIS — N644 Mastodynia: Secondary | ICD-10-CM | POA: Insufficient documentation

## 2017-01-02 HISTORY — DX: Mastodynia: N64.4

## 2018-03-19 ENCOUNTER — Emergency Department
Admission: EM | Admit: 2018-03-19 | Discharge: 2018-03-20 | Disposition: A | Discharge status: Home / Self Care | Payer: Self-pay | Attending: Emergency Medicine | Admitting: Emergency Medicine

## 2018-03-19 ENCOUNTER — Encounter: Payer: Self-pay | Admitting: *Deleted

## 2018-03-19 ENCOUNTER — Other Ambulatory Visit: Payer: Self-pay

## 2018-03-19 DIAGNOSIS — M545 Low back pain: Secondary | ICD-10-CM | POA: Insufficient documentation

## 2018-03-19 DIAGNOSIS — R0789 Other chest pain: Secondary | ICD-10-CM | POA: Insufficient documentation

## 2018-03-19 DIAGNOSIS — R110 Nausea: Secondary | ICD-10-CM | POA: Insufficient documentation

## 2018-03-19 DIAGNOSIS — F17200 Nicotine dependence, unspecified, uncomplicated: Secondary | ICD-10-CM | POA: Insufficient documentation

## 2018-03-19 DIAGNOSIS — R350 Frequency of micturition: Secondary | ICD-10-CM | POA: Insufficient documentation

## 2018-03-19 DIAGNOSIS — R1011 Right upper quadrant pain: Secondary | ICD-10-CM

## 2018-03-19 DIAGNOSIS — N3000 Acute cystitis without hematuria: Secondary | ICD-10-CM | POA: Insufficient documentation

## 2018-03-19 DIAGNOSIS — R109 Unspecified abdominal pain: Secondary | ICD-10-CM

## 2018-03-19 DIAGNOSIS — R3915 Urgency of urination: Secondary | ICD-10-CM | POA: Insufficient documentation

## 2018-03-19 DIAGNOSIS — K8020 Calculus of gallbladder without cholecystitis without obstruction: Secondary | ICD-10-CM | POA: Insufficient documentation

## 2018-03-19 LAB — CBC
HEMATOCRIT: 41.4 % (ref 35.0–47.0)
HEMOGLOBIN: 14.1 g/dL (ref 12.0–16.0)
MCH: 30.5 pg (ref 26.0–34.0)
MCHC: 34.2 g/dL (ref 32.0–36.0)
MCV: 89.4 fL (ref 80.0–100.0)
Platelets: 169 10*3/uL (ref 150–440)
RBC: 4.63 MIL/uL (ref 3.80–5.20)
RDW: 13.3 % (ref 11.5–14.5)
WBC: 5.6 10*3/uL (ref 3.6–11.0)

## 2018-03-19 LAB — BASIC METABOLIC PANEL
ANION GAP: 8 (ref 5–15)
BUN: 11 mg/dL (ref 6–20)
CO2: 26 mmol/L (ref 22–32)
Calcium: 8.8 mg/dL — ABNORMAL LOW (ref 8.9–10.3)
Chloride: 100 mmol/L — ABNORMAL LOW (ref 101–111)
Creatinine, Ser: 0.78 mg/dL (ref 0.44–1.00)
GFR calc Af Amer: >60 mL/min (ref >60)
GFR calc non Af Amer: >60 mL/min (ref >60)
GLUCOSE: 97 mg/dL (ref 65–99)
POTASSIUM: 4 mmol/L (ref 3.5–5.1)
Sodium: 134 mmol/L — ABNORMAL LOW (ref 135–145)

## 2018-03-19 MED ORDER — SODIUM CHLORIDE 0.9 % IV BOLUS (SEPSIS)
1000.0000 mL | Freq: Once | INTRAVENOUS | Status: AC
  Administered 2018-03-19: 1000 mL via INTRAVENOUS

## 2018-03-19 MED ORDER — ACETAMINOPHEN 500 MG PO TABS
1000.0000 mg | ORAL_TABLET | Freq: Once | ORAL | Status: AC
  Administered 2018-03-19: 1000 mg via ORAL
  Filled 2018-03-19: qty 2

## 2018-03-19 MED ORDER — KETOROLAC TROMETHAMINE 30 MG/ML IJ SOLN
15.0000 mg | Freq: Once | INTRAMUSCULAR | Status: AC
Start: 2018-03-19 — End: 2018-03-19
  Administered 2018-03-19: 15 mg via INTRAVENOUS
  Filled 2018-03-19: qty 1

## 2018-03-19 NOTE — ED Triage Notes (Signed)
Patient in no acute distress upon arrival to stat desk. Ambulatory without any difficulty.

## 2018-03-19 NOTE — ED Notes (Signed)
Patient still unable to urinate for sample at this time.

## 2018-03-19 NOTE — ED Notes (Signed)
Pt states that her right flank/side area and her lower back is hurting her since last night. Pt also reports a HA and pain with breathing. Pt is alert and oriented X4. Family at bedside.

## 2018-03-19 NOTE — ED Notes (Signed)
Unable to void at this time.

## 2018-03-19 NOTE — ED Provider Notes (Signed)
[facility] Emergency Department Provider Note __   First MD Initiated Contact with Patient 03/19/18 2306     (approximate)  I have reviewed the triage vital signs and the nursing notes.   HISTORY  Chief Complaint Back Pain and Chest Pain    HPI Jeanette Woods is a 28 y.o. female presents to the emergency department with 1 day history of right flank pain that is currently 7 out of 10 accompanied by urinary urgency and frequency.  Patient denies any dysuria or hematuria.  Patient denies any vomiting but does admit to intermittent nausea.  Patient states that the pain varies in intensity to a maximum of 10 out of 10.   Past Medical History:  Diagnosis Date  . Breast pain     There are no active problems to display for this patient.   Past surgical history C-section  Prior to Admission medications   Medication Sig Start Date End Date Taking? Authorizing Provider  cephALEXin (KEFLEX) 500 MG capsule Take 1 capsule (500 mg total) by mouth 2 (two) times daily for 10 days. 03/20/18 03/30/18  Darci Current, MD  ondansetron (ZOFRAN ODT) 4 MG disintegrating tablet Take 1 tablet (4 mg total) by mouth every 8 (eight) hours as needed for nausea or vomiting. 03/20/18   Darci Current, MD  oxyCODONE-acetaminophen (PERCOCET) 7.5-325 MG tablet Take 1 tablet by mouth every 4 (four) hours as needed for severe pain. 03/20/18 03/20/19  Darci Current, MD    Allergies No Known Drug Allergies  Family History  Problem Relation Age of Onset  . Breast cancer Paternal Grandmother     Social History Social History   Tobacco Use  . Smoking status: Current Every Day Smoker  . Smokeless tobacco: Never Used  Substance Use Topics  . Alcohol use: Yes  . Drug use: Not on file    Review of Systems Constitutional: No fever/chills Eyes: No visual changes. ENT: No sore throat. Cardiovascular: Denies chest pain. Respiratory: Denies shortness of  breath. Gastrointestinal: Positive for right flank pain.  No nausea, no vomiting.  No diarrhea.  No constipation. Genitourinary: Positive for urinary urgency and frequency Musculoskeletal: Negative for neck pain.  Negative for back pain. Integumentary: Negative for rash. Neurological: Negative for headaches, focal weakness or numbness.   ____________________________________________   PHYSICAL EXAM:  VITAL SIGNS: ED Triage Vitals  Enc Vitals Group     BP 03/19/18 2010 108/71     Pulse Rate 03/19/18 2010 (!) 109     Resp 03/19/18 2200 20     Temp 03/19/18 2010 (!) 102.6 F (39.2 C)     Temp Source 03/19/18 2010 Oral     SpO2 03/19/18 2010 99 %     Weight 03/19/18 2010 49.9 kg (110 lb)     Height 03/19/18 2010 1.499 m (4\' 11" )     Head Circumference --      Peak Flow --      Pain Score 03/19/18 2010 8     Pain Loc --      Pain Edu? --      Excl. in GC? --     Constitutional: Alert and oriented. Well appearing and in no acute distress. Eyes: Conjunctivae are normal.  Head: Atraumatic. Mouth/Throat: Mucous membranes are moist.  Oropharynx non-erythematous. Neck: No stridor.   Cardiovascular: Normal rate, regular rhythm. Good peripheral circulation. Grossly normal heart sounds. Respiratory: Normal respiratory effort.  No retractions. Lungs CTAB. Gastrointestinal: Soft and nontender. No  distention.  Musculoskeletal: No lower extremity tenderness nor edema. No gross deformities of extremities. Neurologic:  Normal speech and language. No gross focal neurologic deficits are appreciated.  Skin:  Skin is warm, dry and intact. No rash noted. Psychiatric: Mood and affect are normal. Speech and behavior are normal.  ____________________________________________   LABS (all labs ordered are listed, but only abnormal results are displayed)  Labs Reviewed  BASIC METABOLIC PANEL - Abnormal; Notable for the following components:      Result Value   Sodium 134 (*)    Chloride 100 (*)     Calcium 8.8 (*)    All other components within normal limits  URINALYSIS, COMPLETE (UACMP) WITH MICROSCOPIC - Abnormal; Notable for the following components:   Color, Urine YELLOW (*)    APPearance HAZY (*)    Ketones, ur 5 (*)    Leukocytes, UA SMALL (*)    Bacteria, UA FEW (*)    Squamous Epithelial / LPF 6-30 (*)    All other components within normal limits  CBC  POC URINE PREG, ED  POCT PREGNANCY, URINE     RADIOLOGY I, Calcium N Lilla Callejo, personally viewed and evaluated these images (plain radiographs) as part of my medical decision making, as well as reviewing the written report by the radiologist.    Official radiology report(s): Ct Renal Stone Study  Result Date: 03/20/2018 CLINICAL DATA:  Right flank and lower back pain since last evening. EXAM: CT ABDOMEN AND PELVIS WITHOUT CONTRAST TECHNIQUE: Multidetector CT imaging of the abdomen and pelvis was performed following the standard protocol without IV contrast. COMPARISON:  12/14/2014 CT FINDINGS: Lower chest: Normal heart size without pericardial effusion. Lung bases are clear. Hepatobiliary: Biliary sludge and tiny calculus noted within the gallbladder without gallbladder wall distension, mural thickening or pericholecystic fluid. The gallstone measures approximately 3 mm. The unenhanced liver is unremarkable. Pancreas: No ductal dilatation or mass allowing for limitations of a noncontrast study. Spleen: Normal Adrenals/Urinary Tract: Normal bilateral adrenal glands. No nephrolithiasis nor hydronephrosis. Redemonstration of mild circumferential bladder wall thickening which may reflect a cystitis. No focal mural thickening, mass or calculus is seen within. Stomach/Bowel: Stomach is within normal limits. Appendix appears normal. No evidence of bowel wall thickening, distention, or inflammatory changes. Vascular/Lymphatic: No significant vascular findings are present. No enlarged abdominal or pelvic lymph nodes. Reproductive: Uterus  and bilateral adnexa are unremarkable. Uterus is slightly anteverted. No uterine mass. Small follicles are seen within both ovaries. Other: No free air nor free fluid. Musculoskeletal: No acute osseous abnormality. Stable mild to moderate disc space narrowing at L5-S1. IMPRESSION: 1. No acute bowel inflammation or obstruction. 2. No genitourinary calculi nor hydroureteronephrosis. Mild circumferential thickening of the urinary bladder may reflect a cystitis, similar in appearance to prior. 3. Uncomplicated cholelithiasis with biliary sludge. 4. Stable mild-to-moderate disc space narrowing at L5-S1. Electronically Signed   By: Tollie Eth M.D.   On: 03/20/2018 03:21    ____________________________________________    Procedures   ____________________________________________   INITIAL IMPRESSION / ASSESSMENT AND PLAN / ED COURSE  As part of my medical decision making, I reviewed the following data within the electronic MEDICAL RECORD NUMBER  28 year old female present to the emergency department above-stated history and physical exam with concern for possible urinary tract infection pyelonephritis kidney stone or gallstone.  Laboratory data consistent with possible urinary tract infection CT scan consistent with cholelithiasis.  Patient given Keflex in the emergency department will be prescribed same for home.  Spoke with the  patient at length regarding cholelithiasis and warning signs that would warrant immediate return to the emergency department.  Patient will be referred to Dr. Aleen CampiPiscoya  ____________________________________________  FINAL CLINICAL IMPRESSION(S) / ED DIAGNOSES  Final diagnoses:  Calculus of gallbladder without cholecystitis without obstruction  Acute cystitis without hematuria     MEDICATIONS GIVEN DURING THIS VISIT:  Medications  cephALEXin (KEFLEX) capsule 500 mg (has no administration in time range)  acetaminophen (TYLENOL) tablet 1,000 mg (1,000 mg Oral Given 03/19/18  2034)  sodium chloride 0.9 % bolus 1,000 mL (0 mLs Intravenous Stopped 03/20/18 0309)  ketorolac (TORADOL) 30 MG/ML injection 15 mg (15 mg Intravenous Given 03/19/18 2338)     ED Discharge Orders        Ordered    oxyCODONE-acetaminophen (PERCOCET) 7.5-325 MG tablet  Every 4 hours PRN     03/20/18 0416    ondansetron (ZOFRAN ODT) 4 MG disintegrating tablet  Every 8 hours PRN     03/20/18 0416    cephALEXin (KEFLEX) 500 MG capsule  2 times daily     03/20/18 0425       Note:  This document was prepared using Dragon voice recognition software and may include unintentional dictation errors.    Darci CurrentBrown, [place] N, MD 03/20/18 [phone]

## 2018-03-19 NOTE — ED Triage Notes (Addendum)
Pt reports right anterior rib pain.  No known injury.   Pt also reports lower back pain.  No known injury.  Denies urinary sx.  Hx frequent uti's.  Fever tonight.  Intermittent nausea.

## 2018-03-20 ENCOUNTER — Emergency Department: Payer: Self-pay

## 2018-03-20 LAB — URINALYSIS, COMPLETE (UACMP) WITH MICROSCOPIC
Bilirubin Urine: NEGATIVE
Glucose, UA: NEGATIVE mg/dL
HGB URINE DIPSTICK: NEGATIVE
Ketones, ur: 5 mg/dL — AB
NITRITE: NEGATIVE
PROTEIN: NEGATIVE mg/dL
Specific Gravity, Urine: 1.012 (ref 1.005–1.030)
pH: 6 (ref 5.0–8.0)

## 2018-03-20 LAB — POCT PREGNANCY, URINE: PREG TEST UR: NEGATIVE

## 2018-03-20 MED ORDER — CEPHALEXIN 500 MG PO CAPS
500.0000 mg | ORAL_CAPSULE | Freq: Once | ORAL | Status: AC
  Administered 2018-03-20: 500 mg via ORAL
  Filled 2018-03-20: qty 1

## 2018-03-20 MED ORDER — CEPHALEXIN 500 MG PO CAPS: 500.0000 mg | ORAL_CAPSULE | Freq: Two times a day (BID) | ORAL | 0 refills | Status: AC

## 2018-03-20 MED ORDER — OXYCODONE-ACETAMINOPHEN 7.5-325 MG PO TABS: 1.0000 | ORAL_TABLET | ORAL | 0 refills | Status: AC | PRN

## 2018-03-20 MED ORDER — ONDANSETRON 4 MG PO TBDP: 4.0000 mg | ORAL_TABLET | Freq: Three times a day (TID) | ORAL | 0 refills | Status: DC | PRN

## 2018-03-24 ENCOUNTER — Telehealth: Payer: Self-pay | Admitting: General Practice

## 2018-03-24 NOTE — Telephone Encounter (Signed)
Patients mother is calling patient was seen in the ED, and was told to follow up with Dr. Aleen CampiPiscoya, patients mother stated patient is in a lot of pain and is wanting to get in for surgery however the [facility] is [facility]. Please call and advise.

## 2018-03-25 NOTE — Telephone Encounter (Signed)
Patients mother called back I informed her of this information.

## 2018-03-25 NOTE — Telephone Encounter (Signed)
Please contact patient and advise them that we can not refer the patient to [facility] if the patient has not been seen in the office. If she was just seen in the ED she would need a referral from the patient's PCP.

## 2018-11-24 IMAGING — CT CT RENAL STONE PROTOCOL
3 of 4 series · 8 of 46 positions shown, 15 images · non-contrast
Comparison: 12/14/2014 CT

CLINICAL DATA: Right flank and lower back pain since last evening.

EXAM:
CT ABDOMEN AND PELVIS WITHOUT CONTRAST
TECHNIQUE: Multidetector CT imaging of the abdomen and pelvis was performed
following the standard protocol without IV contrast.

[Series 4: lung bases · axial · 0.57mm/px · z∈[-472,-412]mm · 4 of 21 slices shown, 9 images]
[im 5/21  soft-tissue]
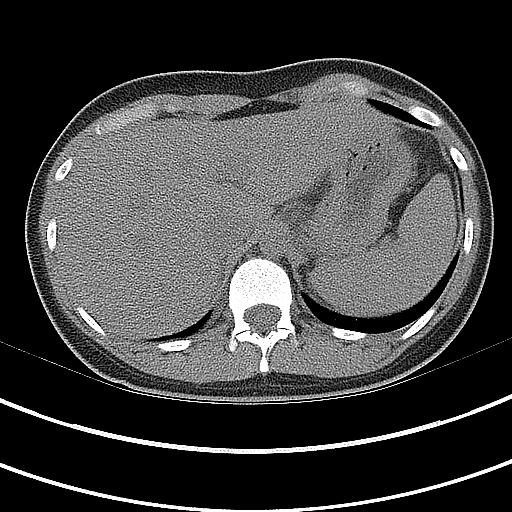
[im 5/21  lung]
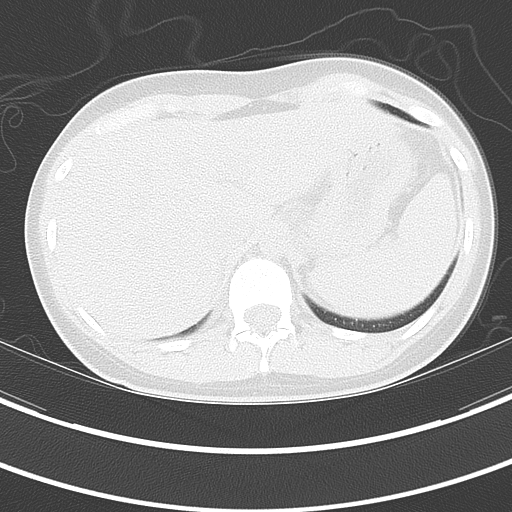
[im 5/21  bone]
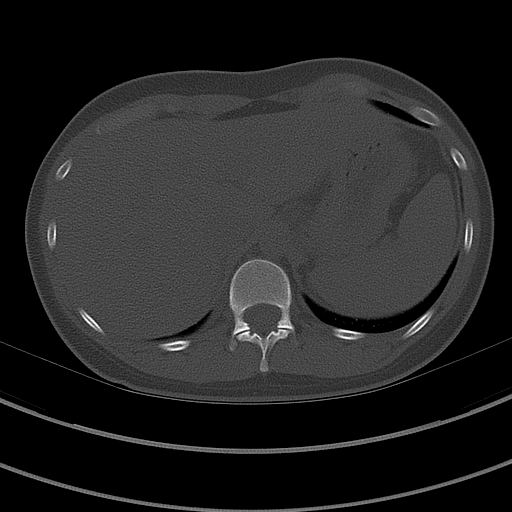
[im 9/21  soft-tissue]
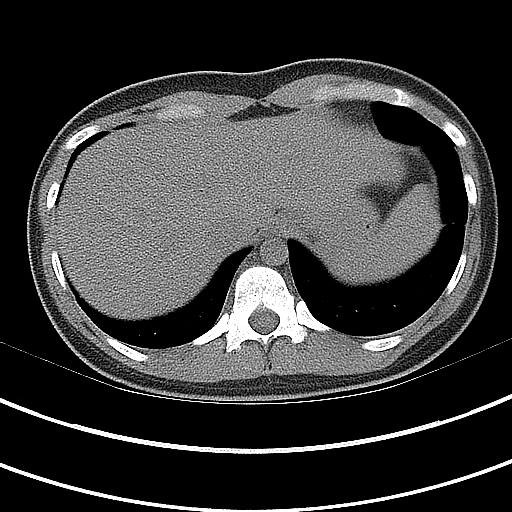
[im 9/21  lung]
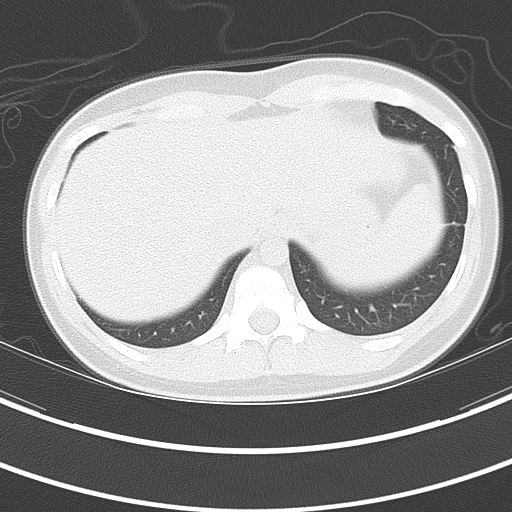
[im 13/21  soft-tissue]
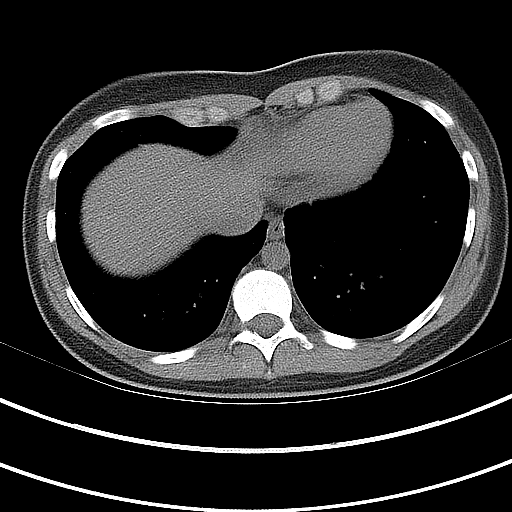
[im 13/21  lung]
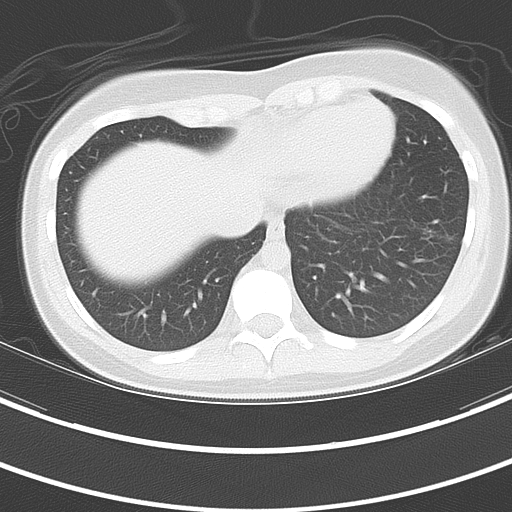
[im 17/21  soft-tissue]
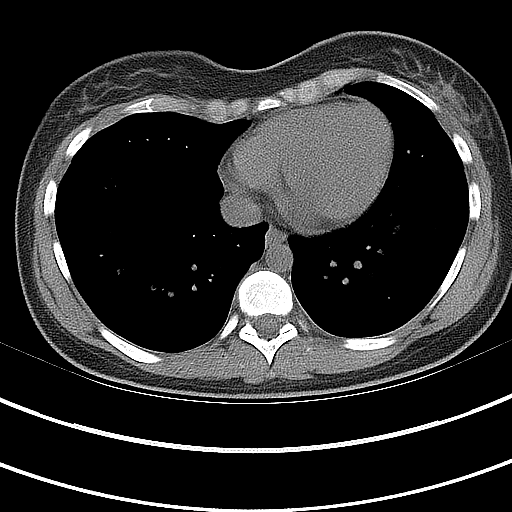
[im 17/21  lung]
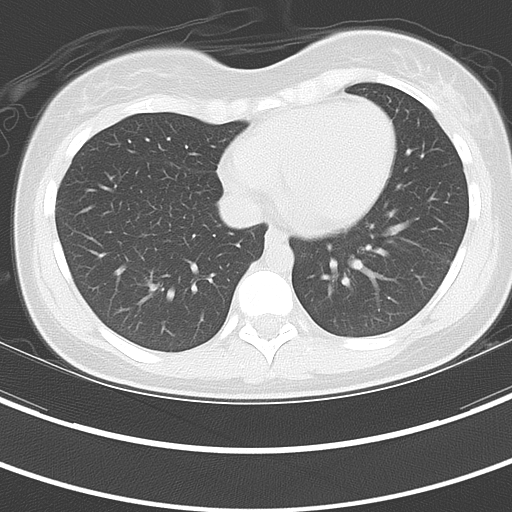

[Series 5: coronal · coronal · 0.64mm/px · 3 of 95 slices shown, 4 images]
[im 32/95  soft-tissue]
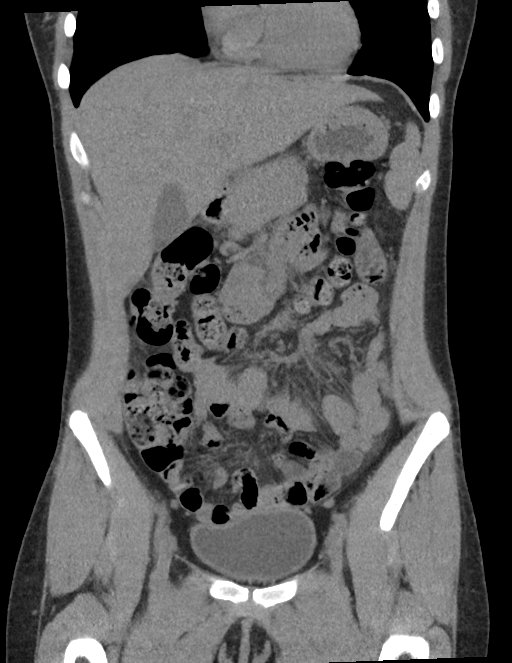
[im 42/95  soft-tissue]
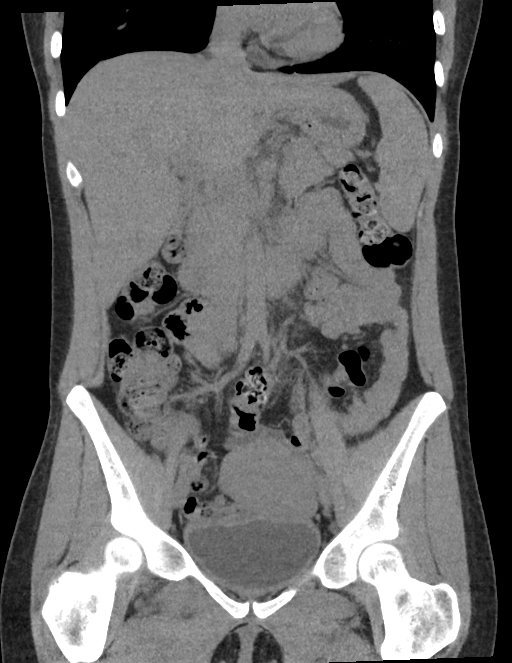
[im 42/95  bone]
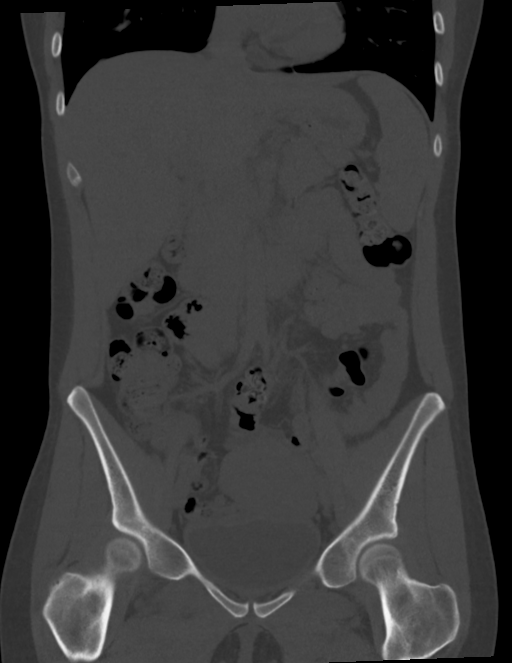
[im 53/95  soft-tissue]
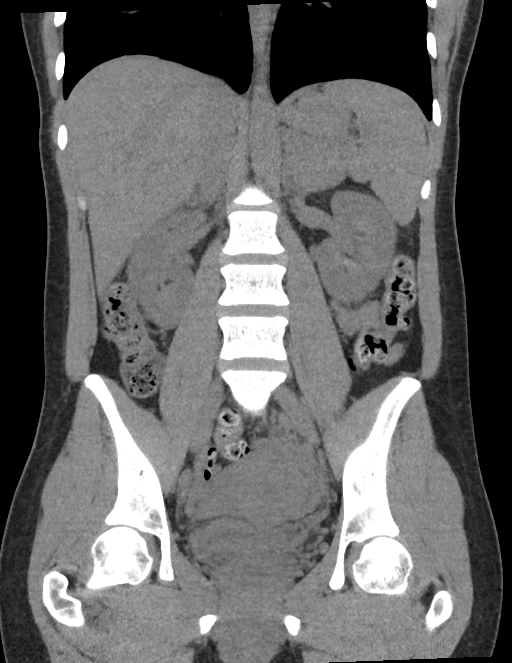

[Series 6: sagittal · sagittal · 0.43mm/px · 1 of 134 slices shown, 2 images]
[im 45/134  soft-tissue]
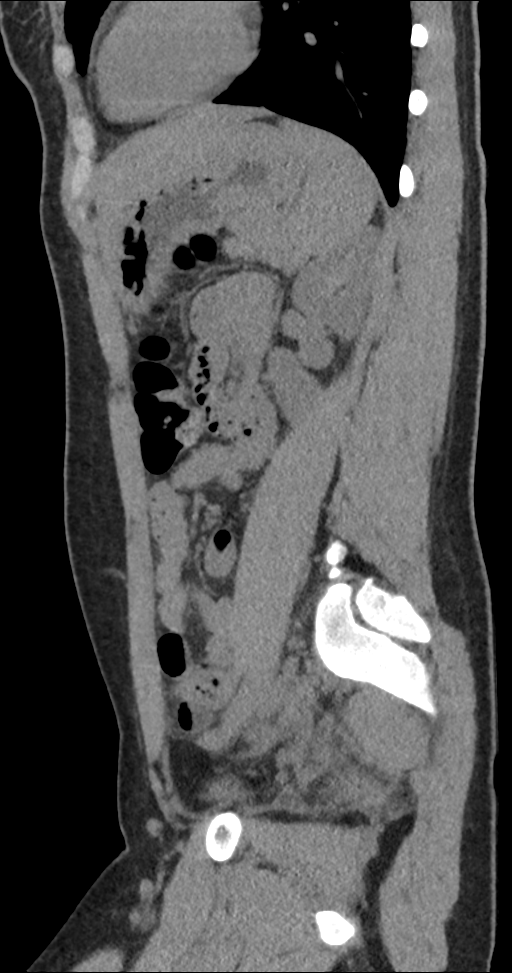
[im 45/134  bone]
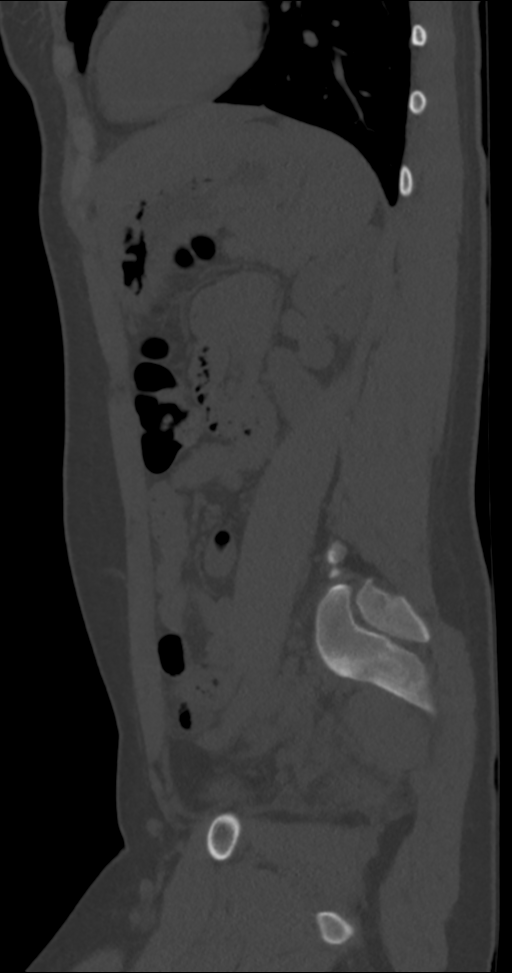

[8 of 46 positions shown; findings below may reference images not displayed]

FINDINGS: Lower chest: Normal heart size without pericardial effusion. Lung
bases are clear.

Hepatobiliary: Biliary sludge and tiny calculus noted within the
gallbladder without gallbladder wall distension, mural thickening or
pericholecystic fluid. The gallstone measures approximately 3 mm.
The unenhanced liver is unremarkable.

Pancreas: No ductal dilatation or mass allowing for limitations of a
noncontrast study.

Spleen: Normal

Adrenals/Urinary Tract: Normal bilateral adrenal glands. No
nephrolithiasis nor hydronephrosis. Redemonstration of mild
circumferential bladder wall thickening which may reflect a
cystitis. No focal mural thickening, mass or calculus is seen
within.

Stomach/Bowel: Stomach is within normal limits. Appendix appears
normal. No evidence of bowel wall thickening, distention, or
inflammatory changes.

Vascular/Lymphatic: No significant vascular findings are present. No
enlarged abdominal or pelvic lymph nodes.

Reproductive: Uterus and bilateral adnexa are unremarkable. Uterus
is slightly anteverted. No uterine mass. Small follicles are seen
within both ovaries.

Other: No free air nor free fluid.

Musculoskeletal: No acute osseous abnormality. Stable mild to
moderate disc space narrowing at L5-S1.
IMPRESSION: 1. No acute bowel inflammation or obstruction.
2. No genitourinary calculi nor hydroureteronephrosis. Mild
circumferential thickening of the urinary bladder may reflect a
cystitis, similar in appearance to prior.
3. Uncomplicated cholelithiasis with biliary sludge.
4. Stable mild-to-moderate disc space narrowing at L5-S1.

## 2019-09-02 ENCOUNTER — Ambulatory Visit
Admission: EM | Admit: 2019-09-02 | Discharge: 2019-09-02 | Disposition: A | Discharge status: Home / Self Care | Payer: PRIVATE HEALTH INSURANCE | Attending: Family | Admitting: Family

## 2019-09-02 ENCOUNTER — Other Ambulatory Visit: Payer: Self-pay

## 2019-09-02 DIAGNOSIS — Z3202 Encounter for pregnancy test, result negative: Secondary | ICD-10-CM

## 2019-09-02 DIAGNOSIS — N760 Acute vaginitis: Secondary | ICD-10-CM

## 2019-09-02 DIAGNOSIS — B9689 Other specified bacterial agents as the cause of diseases classified elsewhere: Secondary | ICD-10-CM | POA: Diagnosis not present

## 2019-09-02 DIAGNOSIS — F439 Reaction to severe stress, unspecified: Secondary | ICD-10-CM

## 2019-09-02 DIAGNOSIS — R109 Unspecified abdominal pain: Secondary | ICD-10-CM

## 2019-09-02 DIAGNOSIS — N939 Abnormal uterine and vaginal bleeding, unspecified: Secondary | ICD-10-CM | POA: Diagnosis not present

## 2019-09-02 LAB — URINALYSIS, COMPLETE (UACMP) WITH MICROSCOPIC
Glucose, UA: NEGATIVE mg/dL
Ketones, ur: 80 mg/dL — AB
Nitrite: NEGATIVE
Protein, ur: NEGATIVE mg/dL
RBC / HPF: >50 RBC/hpf (ref 0–5)
Specific Gravity, Urine: 1.025 (ref 1.005–1.030)
pH: 6.5 (ref 5.0–8.0)

## 2019-09-02 LAB — WET PREP, GENITAL
Sperm: NONE SEEN
Trich, Wet Prep: NONE SEEN
Yeast Wet Prep HPF POC: NONE SEEN

## 2019-09-02 LAB — PREGNANCY, URINE: Preg Test, Ur: NEGATIVE

## 2019-09-02 MED ORDER — METRONIDAZOLE 500 MG PO TABS: 500.0000 mg | ORAL_TABLET | Freq: Two times a day (BID) | ORAL | 0 refills | Status: AC

## 2019-09-02 MED ORDER — SULFAMETHOXAZOLE-TRIMETHOPRIM 800-160 MG PO TABS: 1.0000 | ORAL_TABLET | Freq: Two times a day (BID) | ORAL | 0 refills | Status: AC

## 2019-09-02 NOTE — ED Provider Notes (Signed)
[facility]    CSN: [id number] Arrival date & time: 09/02/19  0902      History   Chief Complaint Chief Complaint  Patient presents with  . Vaginal Bleeding    HPI Jeanette Woods is a 29 y.o. female.   29 year old female presents with unusual vaginal bleeding, lower pelvic pain and back pain. Started her usual period on 8/23 and lasted about 5 days. Did not bleed for 2 days then noticed dark brown blood when she wiped 2 days ago. Uncertain if coming from her vaginal canal or urethra. Having lower abdominal/pelvic cramps mainly on left side with some lower back pain. No other unusual vaginal discharge but has noticed a genital "odor." Denies any fever, nausea, vomiting or diarrhea. Is currently sexually active with 1 partner and does not use condoms or other form of birth control. Does have a history of UTI with main symptom being back pain. Also mom just passed away less than a month ago due to ovarian cancer. Still adjusting and has been under increased stress lately. No other chronic health issues. Takes no daily medication.   The history is provided by the patient.    Past Medical History:  Diagnosis Date  . Breast pain     There are no active problems to display for this patient.   Past Surgical History:  Procedure Laterality Date  . CESAREAN SECTION  05/07/2012  . TONSILLECTOMY      OB History   No obstetric history on file.      Home Medications    Prior to Admission medications   Medication Sig Start Date End Date Taking? Authorizing Provider  metroNIDAZOLE (FLAGYL) 500 MG tablet Take 1 tablet (500 mg total) by mouth 2 (two) times daily. 09/02/19   Sudie GrumblingAmyot, Ann Berry, NP  sulfamethoxazole-trimethoprim (BACTRIM DS) 800-160 MG tablet Take 1 tablet by mouth 2 (two) times daily for 3 days. 09/02/19 09/05/19  Sudie GrumblingAmyot, Ann Berry, NP    Family History Family History  Problem Relation Age of Onset  . Breast cancer Paternal Grandmother   . Ovarian cancer Mother      Social History Social History   Tobacco Use  . Smoking status: Former Games developermoker  . Smokeless tobacco: Never Used  Substance Use Topics  . Alcohol use: Not Currently  . Drug use: Yes    Types: Marijuana     Allergies   Patient has no known allergies.   Review of Systems Review of Systems  Constitutional: Positive for fatigue. Negative for activity change, appetite change, chills and fever.  Respiratory: Negative for cough, chest tightness, shortness of breath and wheezing.   Cardiovascular: Negative for chest pain and palpitations.  Gastrointestinal: Positive for abdominal pain. Negative for diarrhea, nausea and vomiting.  Genitourinary: Positive for decreased urine volume, flank pain, hematuria, pelvic pain and vaginal bleeding. Negative for difficulty urinating, dysuria, frequency, genital sores, urgency and vaginal pain.  Musculoskeletal: Positive for back pain. Negative for arthralgias and myalgias.  Skin: Negative for color change, rash and wound.  Allergic/Immunologic: Negative for environmental allergies and immunocompromised state.  Neurological: Negative for dizziness, tremors, seizures, syncope, weakness, light-headedness, numbness and headaches.  Hematological: Negative for adenopathy. Does not bruise/bleed easily.  Psychiatric/Behavioral: Positive for dysphoric mood. Negative for agitation and suicidal ideas. The patient is not nervous/anxious.      Physical Exam Triage Vital Signs ED Triage Vitals [09/02/19 0920]  Enc Vitals Group     BP 123/89     Pulse  Rate 87     Resp 18     Temp 98.2 F (36.8 C)     Temp Source Oral     SpO2 100 %     Weight 96 lb (43.5 kg)     Height 4' 11.5" (1.511 m)     Head Circumference      Peak Flow      Pain Score 7     Pain Loc      Pain Edu?      Excl. in GC?    No data found.  Updated Vital Signs BP 123/89 (BP Location: Left Arm)   Pulse 87   Temp 98.2 F (36.8 C) (Oral)   Resp 18   Ht 4' 11.5" (1.511 m)    Wt 96 lb (43.5 kg)   LMP 08/31/2019   SpO2 100%   BMI 19.07 kg/m   Visual Acuity Right Eye Distance:   Left Eye Distance:   Bilateral Distance:    Right Eye Near:   Left Eye Near:    Bilateral Near:     Physical Exam Vitals signs and nursing note reviewed. Exam conducted with a chaperone present (Melissa, CMA present during pelvic exam).  Constitutional:      General: She is awake. She is not in acute distress.    Appearance: She is well-developed, well-groomed and underweight. She is not ill-appearing.     Comments: Patient sitting comfortably on exam table in no acute distress but teary when discussing her mom.   HENT:     Head: Normocephalic and atraumatic.     Right Ear: External ear normal.     Left Ear: External ear normal.     Nose: Nose normal.     Mouth/Throat:     Mouth: Mucous membranes are moist.     Pharynx: Oropharynx is clear.  Eyes:     Extraocular Movements: Extraocular movements intact.     Conjunctiva/sclera: Conjunctivae normal.  Neck:     Musculoskeletal: Normal range of motion and neck supple.  Cardiovascular:     Rate and Rhythm: Normal rate and regular rhythm.     Heart sounds: Normal heart sounds. No murmur.  Pulmonary:     Effort: Pulmonary effort is normal. No respiratory distress.     Breath sounds: Normal breath sounds and air entry. No decreased air movement. No decreased breath sounds, wheezing, rhonchi or rales.  Abdominal:     General: Abdomen is flat. Bowel sounds are normal. There is no distension.     Palpations: Abdomen is soft. There is no hepatomegaly or splenomegaly.     Tenderness: There is abdominal tenderness in the suprapubic area and left lower quadrant. There is left CVA tenderness. There is no right CVA tenderness, guarding or rebound.  Genitourinary:    General: Normal vulva.     Exam position: Lithotomy position.     Pubic Area: No rash.      Labia:        Right: No rash, tenderness or lesion.        Left: No rash,  tenderness or lesion.      Vagina: No signs of injury and foreign body. Bleeding (dark red to brown blood present mainly near cervical os) present. No erythema or tenderness.     Cervix: Cervical bleeding present. No cervical motion tenderness or erythema.     Uterus: Normal.      Adnexa:        Right: Tenderness present. No mass or  fullness.         Left: Tenderness present. No mass or fullness.    Musculoskeletal: Normal range of motion.  Lymphadenopathy:     Lower Body: No right inguinal adenopathy. No left inguinal adenopathy.  Skin:    General: Skin is warm and dry.     Findings: No rash.  Neurological:     General: No focal deficit present.     Mental Status: She is alert and oriented to person, place, and time.  Psychiatric:        Attention and Perception: Attention normal.        Mood and Affect: Affect is tearful.        Speech: Speech normal.        Behavior: Behavior normal. Behavior is cooperative.        Thought Content: Thought content normal.        Cognition and Memory: Cognition and memory normal.        Judgment: Judgment normal.      UC Treatments / Results  Labs (all labs ordered are listed, but only abnormal results are displayed) Labs Reviewed  WET PREP, GENITAL - Abnormal; Notable for the following components:      Result Value   Clue Cells Wet Prep HPF POC PRESENT (*)    WBC, Wet Prep HPF POC MODERATE (*)    All other components within normal limits  URINALYSIS, COMPLETE (UACMP) WITH MICROSCOPIC - Abnormal; Notable for the following components:   APPearance CLOUDY (*)    Hgb urine dipstick MODERATE (*)    Bilirubin Urine SMALL (*)    Ketones, ur 80 (*)    Leukocytes,Ua SMALL (*)    Bacteria, UA MANY (*)    All other components within normal limits  URINE CULTURE  PREGNANCY, URINE    EKG   Radiology No results found.  Procedures Procedures (including critical care time)  Medications Ordered in UC Medications - No data to display   Initial Impression / Assessment and Plan / UC Course  I have reviewed the triage vital signs and the nursing notes.  Pertinent labs & imaging results that were available during my care of the patient were reviewed by me and considered in my medical decision making (see chart for details).    Reviewed negative urine pregnancy test with patient. Reviewed wet prep results- positive for BV, negative for yeast and Trich. Reviewed urinalysis results with patient- uncertain if UTI- WBC and bacteria may be due to BV but will treat for BV and possible UTI. Sent urine for culture. Patient slightly dehydrated and has not eaten recently which helps explain positive ketones in urine. Patient declines GC and Chlamydia testing and unable to obtain another sample for STD testing. Discussed that BV can cause irregular bleeding along with current stress. Continue to monitor. Will start Flagyl 500mg  twice a day as directed. May start Bactrim DS twice a day for 3 days. Continue to increase fluid intake. May take Ibuprofen 600mg  every 6 to 8 hours as needed for pain. No sexual intercourse for 7 days. Do not douche or use any fragrant body washes. Clean with mild soap and water only. Encouraged to call and seek counseling through Hospice to help deal with recent death of her mom. Note written for work. Follow-up pending urine culture results.   Final Clinical Impressions(s) / UC Diagnoses   Final diagnoses:  BV (bacterial vaginosis)  Acute left flank pain  Abnormal vaginal bleeding  Situational stress  Discharge Instructions     Recommend start Flagyl 500mg  twice a day for 7 days- take with food. Recommend also start Bactrim twice a day for 3 days for possible UTI. Continue to push fluids. Do not douche or wash with fragrant body washes.  May also take Ibuprofen 600mg  every 6 to 8 hours as needed for pain. No sexual intercourse for 7 days. Follow-up pending urine culture results.     ED Prescriptions     Medication Sig Dispense Auth. Provider   metroNIDAZOLE (FLAGYL) 500 MG tablet Take 1 tablet (500 mg total) by mouth 2 (two) times daily. 14 tablet Sudie GrumblingAmyot, Ann Berry, NP   sulfamethoxazole-trimethoprim (BACTRIM DS) 800-160 MG tablet Take 1 tablet by mouth 2 (two) times daily for 3 days. 6 tablet Sudie GrumblingAmyot, Ann Berry, NP     Controlled Substance Prescriptions [place] Controlled Substance Registry consulted? Not Applicable   Sudie Grumblingmyot, Ann Berry, NP 09/02/19 1652

## 2019-09-02 NOTE — Discharge Instructions (Addendum)
Recommend start Flagyl 500mg  twice a day for 7 days- take with food. Recommend also start Bactrim twice a day for 3 days for possible UTI. Continue to push fluids. Do not douche or wash with fragrant body washes.  May also take Ibuprofen 600mg  every 6 to 8 hours as needed for pain. No sexual intercourse for 7 days. Follow-up pending urine culture results.

## 2019-09-02 NOTE — ED Triage Notes (Signed)
Patient complains of unusual vaginal bleeding. Patient states that she started her cycle on Sunday 23rd. Patient states that she has her cycle ended this past Saturday but then came back on Monday this week. Patient states that she is concerned this was brought on by stress as her mother passed away 1 month ago.

## 2019-09-04 LAB — URINE CULTURE
Culture: >=100000 — AB
Special Requests: NORMAL

## 2019-09-07 ENCOUNTER — Telehealth (HOSPITAL_COMMUNITY): Payer: Self-pay | Admitting: Emergency Medicine

## 2019-09-07 NOTE — Telephone Encounter (Signed)
Urine culture was positive for e coli and was given bactrim  at urgent care visit.. Attempted to reach patient. No answer at this time.
